# Patient Record
Sex: Female | Born: 1960
Health system: Southern US, Community
[De-identification: ages and names within clinical notes are randomized; demographics above are authoritative.]

## PROBLEM LIST (undated history)

## (undated) HISTORY — PX: OTHER SURGICAL HISTORY: SHX169

---

## 1997-11-26 ENCOUNTER — Other Ambulatory Visit: Admission: RE | Admit: 1997-11-26 | Discharge: 1997-11-26 | Payer: Self-pay | Admitting: Obstetrics and Gynecology

## 1999-06-25 ENCOUNTER — Other Ambulatory Visit
Admission: RE | Admit: 1999-06-25 | Discharge: 1999-06-25 | Payer: Self-pay | Admitting: Physical Medicine & Rehabilitation

## 1999-10-13 ENCOUNTER — Encounter (INDEPENDENT_AMBULATORY_CARE_PROVIDER_SITE_OTHER): Payer: Self-pay | Admitting: Specialist

## 1999-10-13 ENCOUNTER — Ambulatory Visit (HOSPITAL_COMMUNITY): Admission: RE | Admit: 1999-10-13 | Discharge: 1999-10-13 | Payer: Self-pay | Admitting: Obstetrics and Gynecology

## 2000-07-14 ENCOUNTER — Other Ambulatory Visit: Admission: RE | Admit: 2000-07-14 | Discharge: 2000-07-14 | Payer: Self-pay | Admitting: Obstetrics and Gynecology

## 2000-07-27 ENCOUNTER — Ambulatory Visit (HOSPITAL_COMMUNITY): Admission: RE | Admit: 2000-07-27 | Discharge: 2000-07-27 | Payer: Self-pay | Admitting: Obstetrics and Gynecology

## 2000-07-27 ENCOUNTER — Encounter: Payer: Self-pay | Admitting: Obstetrics and Gynecology

## 2001-08-01 ENCOUNTER — Encounter: Payer: Self-pay | Admitting: Obstetrics and Gynecology

## 2001-08-01 ENCOUNTER — Ambulatory Visit (HOSPITAL_COMMUNITY): Admission: RE | Admit: 2001-08-01 | Discharge: 2001-08-01 | Payer: Self-pay | Admitting: Obstetrics and Gynecology

## 2001-12-27 ENCOUNTER — Ambulatory Visit (HOSPITAL_COMMUNITY): Admission: RE | Admit: 2001-12-27 | Discharge: 2001-12-27 | Payer: Self-pay | Admitting: Gastroenterology

## 2002-08-03 ENCOUNTER — Encounter: Admission: RE | Admit: 2002-08-03 | Discharge: 2002-08-03 | Payer: Self-pay | Admitting: Obstetrics and Gynecology

## 2002-08-03 ENCOUNTER — Encounter: Payer: Self-pay | Admitting: Obstetrics and Gynecology

## 2003-08-07 ENCOUNTER — Other Ambulatory Visit
Admission: RE | Admit: 2003-08-07 | Discharge: 2003-08-07 | Payer: Self-pay | Admitting: Physical Medicine & Rehabilitation

## 2003-09-05 ENCOUNTER — Encounter: Admission: RE | Admit: 2003-09-05 | Discharge: 2003-09-05 | Payer: Self-pay | Admitting: Obstetrics and Gynecology

## 2004-10-14 ENCOUNTER — Other Ambulatory Visit: Admission: RE | Admit: 2004-10-14 | Discharge: 2004-10-14 | Payer: Self-pay | Admitting: Obstetrics and Gynecology

## 2004-11-04 ENCOUNTER — Encounter: Admission: RE | Admit: 2004-11-04 | Discharge: 2004-11-04 | Payer: Self-pay | Admitting: Obstetrics and Gynecology

## 2004-11-12 ENCOUNTER — Encounter: Admission: RE | Admit: 2004-11-12 | Discharge: 2004-11-12 | Payer: Self-pay | Admitting: Obstetrics and Gynecology

## 2005-11-24 ENCOUNTER — Encounter: Admission: RE | Admit: 2005-11-24 | Discharge: 2005-11-24 | Payer: Self-pay | Admitting: Obstetrics and Gynecology

## 2006-12-12 ENCOUNTER — Encounter: Admission: RE | Admit: 2006-12-12 | Discharge: 2006-12-12 | Payer: Self-pay | Admitting: Obstetrics and Gynecology

## 2007-12-21 ENCOUNTER — Encounter: Admission: RE | Admit: 2007-12-21 | Discharge: 2007-12-21 | Payer: Self-pay | Admitting: Obstetrics and Gynecology

## 2009-07-07 ENCOUNTER — Encounter: Admission: RE | Admit: 2009-07-07 | Discharge: 2009-07-07 | Payer: Self-pay | Admitting: Obstetrics and Gynecology

## 2010-06-28 ENCOUNTER — Encounter: Payer: Self-pay | Admitting: Obstetrics and Gynecology

## 2010-10-23 NOTE — Procedures (Signed)
[facility]  Patient:    [name], [name] Visit Number: [id number] MRN: [id number]          Service Type: END Location: ENDO Attending Physician:  Dennison Bulla Ii Dictated by:   Verlin Grills, M.D. Proc. Date: 12/27/01 Admit Date:  12/27/2001 Discharge Date: 12/27/2001   CC:         Lafonda Mosses B. Thomasena Edis, M.D.   Procedure Report  PROCEDURE:  Diagnostic colonoscopy.  REFERRING PHYSICIAN:  Lafonda Mosses B. Thomasena Edis, M.D.  PROCEDURE INDICATION:  [name] is a 50 year old female born Aug 06, 1960.  [name] underwent her health maintenance physical examination performed by Dr. Artist Pais.  She submitted stool Hemoccult cards for testing, and her Hemoccult cards were guaiac-positive. [name] tells me she did have symptomatic hemorrhoids prior to submitting her stool Hemoccult cards.  She denies a personal or family history of colon cancer.  MEDICATION ALLERGIES:  None.  CHRONIC MEDICATIONS:  Birth control pills.  PAST MEDICAL HISTORY:  None.  PAST SURGICAL HISTORY:  D&C to remove uterine polyp, May 2001.  HABITS:  [name] does not smoke cigarettes or consume alcohol.  SOCIAL HISTORY:  [name] 68 year old husband is in good health.  She has no children.  ENDOSCOPIST:  Verlin Grills, M.D.  PREMEDICATION:  Versed 7 mg, Demerol 500 mg.  ENDOSCOPE:  Olympus pediatric colonoscope.  DESCRIPTION OF PROCEDURE:  After obtaining informed consent, [name] was placed in the left lateral decubitus position.  I administered intravenous Versed and intravenous Demerol to achieve conscious sedation for the procedure.  The patients blood pressure, oxygen saturation, and cardiac rhythm were monitored throughout the procedure and documented in the medical record.  Anal inspection was normal.  Digital rectal examination revealed moderate-sized external hemorrhoids which did not feel thrombosed, and there was no bleeding.    The Olympus pediatric video colonoscope was introduced into the rectum and easily advanced to the cecum.  Colonic preparation for the exam today was excellent.  RECTUM:  Normal.  SIGMOID COLON AND DESCENDING COLON:  Normal.  SPLENIC FLEXURE:  Normal.  TRANSVERSE COLON:  Normal.  HEPATIC FLEXURE:  Normal.  ASCENDING COLON:  Normal.  CECUM AND ILEOCECAL VALVE:  Normal.  ASSESSMENT:  Normal proctocolonoscopy to the cecum.  No endoscopic evidence for the presence of colorectal neoplasia or inflammatory bowel disease. Dictated by:   Verlin Grills, M.D. Attending Physician:  Dennison Bulla Ii DD:  12/27/01 TD:  12/30/01 Job: [id number]

## 2010-10-23 NOTE — Op Note (Signed)
[facility]  Patient:    [name], [name]                        MRN: [id number] Proc. Date: 10/13/99 Adm. Date:  [id number] Attending:  Madelyn Flavors                           Operative Report  PREOPERATIVE DIAGNOSIS:       Endometrial polyps.  POSTOPERATIVE DIAGNOSIS:      Endometrial polyps.  OPERATION:                    Dilatation and curettage.  Hysteroscopy.  SURGEON:                      Beather Arbour. Thomasena Edis, M.D.  ASSISTANT:  ANESTHESIA:                   General by LMA.  ESTIMATED BLOOD LOSS:         Minimal.  COMPLICATIONS:                None.  DRAINS:                       None.  FLUIDS:                       Approximately 900 cc of Crystalloid.  DESCRIPTION OF PROCEDURE:     The patient was brought to the operating room and  identified on the operating table.  After induction of adequate general anesthesia, the patient was placed in the dorsal lithotomy position and prepped and draped n the usual sterile fashion.  The bladder was straight catheterized for approximately 100 cc of clear yellow urine.  Examination under anesthesia revealed the uterus to be six weeks, mobile, and retroverted.  The posterior lip of the cervix was grasped with a single tooth tenaculum and dilated up to a #25 Pratt dilator. Dilatation proceeded very carefully and smoothly.  This was done to decrease the risk of uterine perforation.  The hysteroscope was placed with ease and using Sorbitol s the distending medium, a very careful and thorough hysteroscopic examination was performed.  The polypoid tissue was visualized.  The hysteroscope was removed and the uterus was systematically curetted in a systematic clockwise fashion with copious tissue obtained.  The Randall stone forceps were placed and additional tissue was obtained.  After there was no further tissue obtained and a good cry was heard all around, the procedure was then terminated.   The tissue was sent to pathology for examination.  There was noted to be very minimal bleeding from the tenaculum site.  Approximately 10 cc of lidocaine were placed for a paracervical block for postoperative analgesia.  The patient did receive Toradol 30 mg IV just prior to the end of the case for analgesia as well.  The patient tolerated the procedure well without apparent complications and was transferred to the recovery room in stable condition after all sponge, needle, and instrument counts were correct.  She was given the D&C postoperative instruction sheet, urged to return to the office in two to three weeks for a postoperative appointment and to use ibuprofen two to three pills that is 400 to 600 mg as needed for pain every six  hours. DD:  10/13/99 TD:  10/13/99 Job: [id number]  [id number]

## 2010-12-25 ENCOUNTER — Other Ambulatory Visit: Payer: Self-pay | Admitting: Otolaryngology

## 2010-12-25 DIAGNOSIS — R221 Localized swelling, mass and lump, neck: Secondary | ICD-10-CM

## 2010-12-28 ENCOUNTER — Ambulatory Visit
Admission: RE | Admit: 2010-12-28 | Discharge: 2010-12-28 | Disposition: A | Discharge status: Home / Self Care | Payer: 59 | Source: Ambulatory Visit | Attending: Otolaryngology | Admitting: Otolaryngology

## 2010-12-28 DIAGNOSIS — R221 Localized swelling, mass and lump, neck: Secondary | ICD-10-CM

## 2010-12-28 MED ORDER — IOHEXOL 300 MG/ML  SOLN
75.0000 mL | Freq: Once | INTRAMUSCULAR | Status: AC | PRN
  Administered 2010-12-28: 75 mL via INTRAVENOUS

## 2010-12-29 ENCOUNTER — Other Ambulatory Visit: Payer: Self-pay | Admitting: Otolaryngology

## 2010-12-29 ENCOUNTER — Other Ambulatory Visit (HOSPITAL_COMMUNITY)
Admission: RE | Admit: 2010-12-29 | Discharge: 2010-12-29 | Disposition: A | Discharge status: Home / Self Care | Payer: 59 | Source: Ambulatory Visit | Attending: Otolaryngology | Admitting: Otolaryngology

## 2010-12-29 DIAGNOSIS — R220 Localized swelling, mass and lump, head: Secondary | ICD-10-CM | POA: Insufficient documentation

## 2010-12-29 DIAGNOSIS — R221 Localized swelling, mass and lump, neck: Secondary | ICD-10-CM | POA: Insufficient documentation

## 2011-01-07 ENCOUNTER — Encounter (HOSPITAL_COMMUNITY)
Admission: RE | Admit: 2011-01-07 | Discharge: 2011-01-07 | Disposition: A | Discharge status: Home / Self Care | Payer: 59 | Source: Ambulatory Visit | Attending: Otolaryngology | Admitting: Otolaryngology

## 2011-01-07 LAB — CBC
HCT: 32.2 % — ABNORMAL LOW (ref 36.0–46.0)
MCH: 31.2 pg (ref 26.0–34.0)
MCHC: 34.8 g/dL (ref 30.0–36.0)
MCV: 89.7 fL (ref 78.0–100.0)
Platelets: 228 10*3/uL (ref 150–400)

## 2011-01-07 LAB — ELECTROLYTE PANEL: Chloride: 104 mEq/L (ref 96–112)

## 2011-01-07 LAB — DIFFERENTIAL
Basophils Absolute: 0 K/uL (ref 0.0–0.1)
Basophils Relative: 0 % (ref 0–1)
Eosinophils Absolute: 0.2 K/uL (ref 0.0–0.7)
Eosinophils Relative: 4 % (ref 0–5)
Lymphocytes Relative: 55 % — ABNORMAL HIGH (ref 12–46)
Lymphs Abs: 2.8 K/uL (ref 0.7–4.0)
Monocytes Absolute: 0.3 K/uL (ref 0.1–1.0)
Monocytes Relative: 6 % (ref 3–12)
Neutro Abs: 1.7 K/uL (ref 1.7–7.7)
Neutrophils Relative %: 34 % — ABNORMAL LOW (ref 43–77)

## 2011-01-07 LAB — TYPE AND SCREEN
ABO/RH(D): A POS
Antibody Screen: NEGATIVE

## 2011-01-07 LAB — PROTIME-INR
INR: 1.03 (ref 0.00–1.49)
Prothrombin Time: 13.7 s (ref 11.6–15.2)

## 2011-01-07 LAB — SURGICAL PCR SCREEN
MRSA, PCR: NEGATIVE
Staphylococcus aureus: NEGATIVE

## 2011-01-08 ENCOUNTER — Inpatient Hospital Stay (HOSPITAL_COMMUNITY)
Admission: RE | Admit: 2011-01-08 | Discharge: 2011-01-09 | DRG: 139 | Disposition: A | Discharge status: Home / Self Care | Payer: 59 | Source: Ambulatory Visit | Attending: Otolaryngology | Admitting: Otolaryngology

## 2011-01-08 ENCOUNTER — Other Ambulatory Visit: Payer: Self-pay | Admitting: Otolaryngology

## 2011-01-08 DIAGNOSIS — R51 Headache: Secondary | ICD-10-CM | POA: Diagnosis present

## 2011-01-08 DIAGNOSIS — K1120 Sialoadenitis, unspecified: Principal | ICD-10-CM | POA: Diagnosis present

## 2011-01-10 NOTE — Op Note (Signed)
[name], [name] NO.:  [id number]  MEDICAL RECORD NO.:  [id number]  LOCATION:  5124                         FACILITY:  [facility]  PHYSICIAN:  Melvenia Beam, MD      DATE OF BIRTH:  Feb 10, 1961  DATE OF PROCEDURE:  01/08/2011 DATE OF DISCHARGE:                              OPERATIVE REPORT   PREOPERATIVE DIAGNOSES:  Level IIA/tail of parotid neck mass.  POSTOPERATIVE DIAGNOSES:  Level IIA/tail of parotid neck mass/right post-inflammatory tail of parotid mass.  PROCEDURES PERFORMED: 1. CPT code 38510-R right open biopsy of deep neck mass. 2. 95920 facial nerve monitoring x2 hours.  SURGEON:  Melvenia Beam, MD  ASSISTANT:  Aquilla Hacker, PA-C  OPERATIVE FINDINGS:  Intact right facial nerve in all branches, intact right great auricular nerve in all branches, right level IIA/tail of parotid neck mass, negative on frozen section for malignancy and showing chronic inflammatory reaction, fibrosis and salivary gland tissue.  SPECIMENS:  Right parotid mass #1 and right parotid mass #2.  COMPLICATIONS:  None.  ANESTHESIA:  General endotracheal.  DRAINS:  None.  DRESSINGS:  Bacitracin ointment.  OPERATIVE DETAILS:  The patient was correctly identified in the preoperative holding area, brought back to operating room by Anesthesia, placed on the operating table, intubated without incident.  The table was turned 90 degrees counterclockwise by Anesthesia.  The right level II neck mass was palpated and marked out with an X.  The inferior portion of a standard modified Blair incision was then drawn out taking care to include this in a natural crease in the neck.  The area was injected 1% lidocaine plain, and then the entire right face and neck were prepped and draped with Betadine and sterile towels in the standard fashion.  Next, the incision was made with the 15 blade, carried down through the platysma layer.  The platysma was divided and then posteriorly,  directly over the mass, the skin flap was elevated in a supraplatysmal plane anteriorly and in the anterior and lower neck, the flap was elevated in a subplatysmal plane posteriorly and inferiorly, the flap was elevated in a subplatysmal plane.  The skin flaps were then carefully retracted with 2-0 silk and snaps.  Next, the Bovie electrocautery and the mosquito forceps were used to carefully identify the right great auricular nerve.  This was carefully traced superiorly along the sternocleidomastoid muscle all the way superiorly near the mastoid tip. The right tail of parotid mass was palpated and was noted that the great auricular nerve coursed just posterior to this.  The great auricular nerve was then carefully traced out in all of its branches preserving all three of these branches superiorly, and the right tail of parotid mass was carefully dissected off of the great auricular nerve with the mosquito and bipolar.  The right tail of parotid mass was then carefully dissected off of the mastoid tip and sternocleidomastoid muscle and removed in two pieces as right tail of parotid mass #1 and right tail of parotid mass #2.  The nerve monitor was used continuously to ensure that the right marginal mandibular nerve was not injured or divided, and the right facial nerve  stimulated normally throughout  the case in its upper and lower divisions.  Once the mass was carefully removed, it was sent off for frozen section pathology.  This came back as showing background inflammation, chronic fibrosis, and salivary glad tissue with no evidence of any neoplasia and no evidence of any malignancy. We ensured meticulous hemostasis using the bipolar electrocautery and then the wound was closed in two layers closing the platysmal and subcutaneous layers, and closing this with 3-0 Vicryl interrupted sutures, which were buried and then closing the skin with interrupted simple 5-0 Prolene sutures.  The  patient's face was cleaned of the  Betadine prep.  She was returned back to Anesthesia, awaked from anesthesia, extubated without incident, and taken to the postop recovery room in good condition.  The patient tolerated the procedure well without any complications.    Dr. Melvenia Beam was present and performed the entire procedure.  The assistance of Aquilla Hacker was necessary as no Sales promotion account executive was available.          ______________________________ Melvenia Beam, MD     MG/MEDQ  D:  01/08/2011  T:  01/09/2011  Job:  [id number]  Electronically Signed by Melvenia Beam MD on 01/10/2011 03:59:51 PM

## 2011-01-10 NOTE — H&P (Signed)
  [name], Andrea NO.:  [id number]  MEDICAL RECORD NO.:  [id number]  LOCATION:  SDS                          FACILITY:  [facility]  PHYSICIAN:  Andrea Beam, MD      DATE OF BIRTH:  1960/12/08  DATE OF ADMISSION:  01/08/2011 DATE OF DISCHARGE:  01/09/2011                             HISTORY & PHYSICAL   REASON FOR ADMISSION:  Status post excision of a right tail of parotid/level II benign neck mass and the patient is admitted for postoperative monitoring overnight.  HISTORY OF PRESENT ILLNESS:  This is a very pleasant 50 year old Caucasian female who has had a 3-4 week history of a right tail of parotid mass which was initially quite inflamed and tender to palpate. This was treated empirically as parotitis with 2 rounds of doxycycline and the mass significantly improved in its appearance, but CT scan obtained after the doxycycline course demonstrated a persistent 2.5 cm right tail of parotid/level IIA neck mass with surrounding associated lymphadenopathy with findings worrisome for possible malignancy.  Given the findings, an FNA was performed which came back as necrosis, salivary gland tissue and chronic inflammation.  The mass continued to persist although somewhat smaller after an additional round of doxycycline. Given this, after extensive discussion with the patient regarding the risks, benefits and alternatives of surgery versus observation.  The patient elected to undergo excisional biopsy of this, which she underwent on January 08, 2011.  PAST MEDICAL AND SURGICAL HISTORY:  Significant for gingivoplasty, otherwise negative.  She is a nonsmoker.  PHYSICAL EXAMINATION:  NECK:  Reveals a right modified Blair-type incision, status post excision of the right level II A neck mass.  This is clean, dry, and intact with Prolene sutures with no evidence of any hematoma and the neck is soft and flat.  Trachea is midline. NEURO:  Cranial nerves II-XII grossly  intact and symmetric bilaterally. GENERAL:  The patient is awake and alert, in no acute distress. HEENT:  Oral cavity and oropharynx symmetric and well hydrated.  Moist mucous membranes except the oral cavity. No masses or lesions. Extraocular movements intact.  Pupils equal, round, reactive to light and accommodation.  The patient is normocephalic and atraumatic.  IMPRESSION AND PLAN:  Status post excision of right tail of parotid benign postinflammatory mass.  We will admit the patient overnight for observation with standard postop antibiotics, postoperative antinausea medicines and pain medications.  We will anticipate discharge in the morning.          ______________________________ Andrea Beam, MD    MG/MEDQ  D:  01/08/2011  T:  01/09/2011  Job:  [id number]  Electronically Signed by Andrea Beam MD on 01/10/2011 03:54:12 PM

## 2011-03-03 ENCOUNTER — Other Ambulatory Visit: Payer: Self-pay | Admitting: Otolaryngology

## 2011-03-03 ENCOUNTER — Ambulatory Visit
Admission: RE | Admit: 2011-03-03 | Discharge: 2011-03-03 | Disposition: A | Discharge status: Home / Self Care | Payer: 59 | Source: Ambulatory Visit | Attending: Otolaryngology | Admitting: Otolaryngology

## 2011-03-03 DIAGNOSIS — R220 Localized swelling, mass and lump, head: Secondary | ICD-10-CM

## 2011-03-03 DIAGNOSIS — R221 Localized swelling, mass and lump, neck: Secondary | ICD-10-CM

## 2013-07-10 IMAGING — CT CT NECK W/ CM
4 of 5 series · 15 of 33 positions shown, 18 images · IV contrast (75CC OMNI 300)
Comparison: None

CLINICAL DATA: Mass at the angle of the mandible on the right.
Pain.

CT NECK WITH CONTRAST
TECHNIQUE: Multidetector CT imaging of the neck was performed with
intravenous contrast.
Contrast: 75 ml Bmnipaque-APP

[Series 2: axial neck · axial · 0.35mm/px · z∈[-192,-145]mm · 2 of 97 slices shown]
[im 20/97  bone]
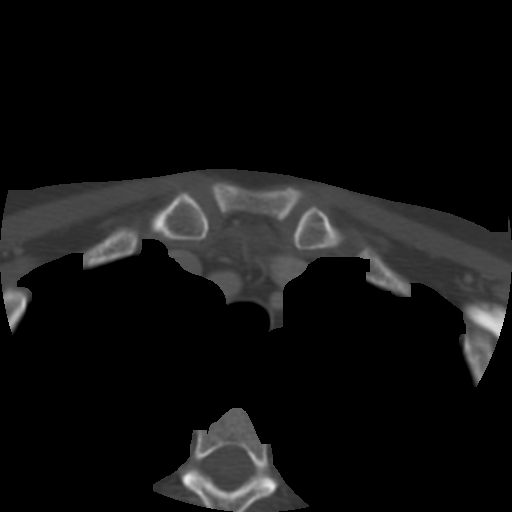
[im 39/97  bone]
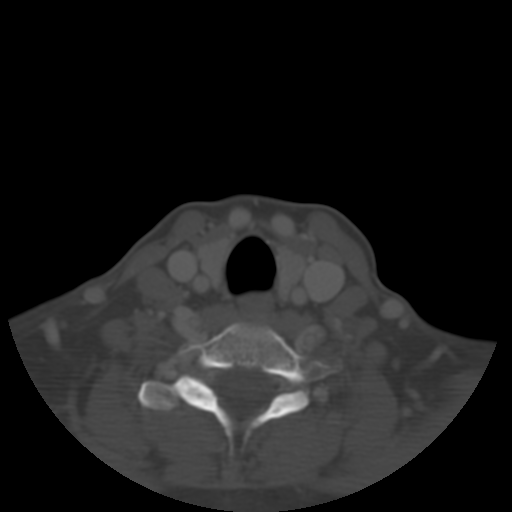

[Series 400: cor · coronal · 0.48mm/px · 3 of 86 slices shown]
[im 22/86  bone]
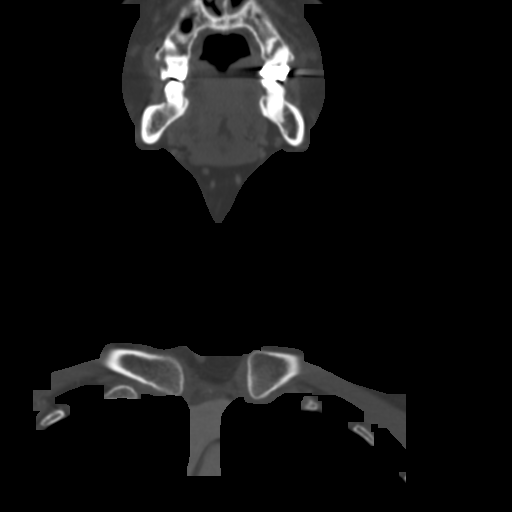
[im 36/86  bone]
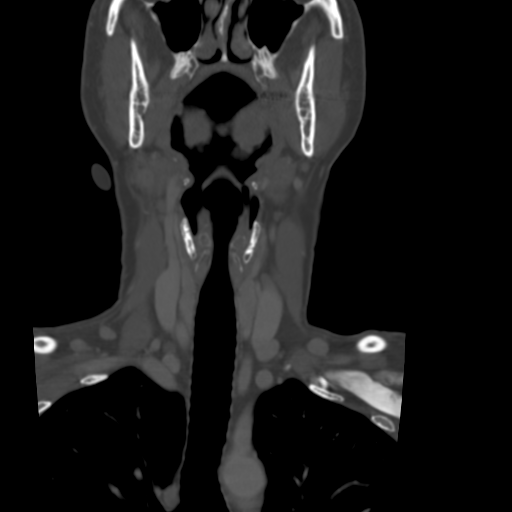
[im 50/86  bone]
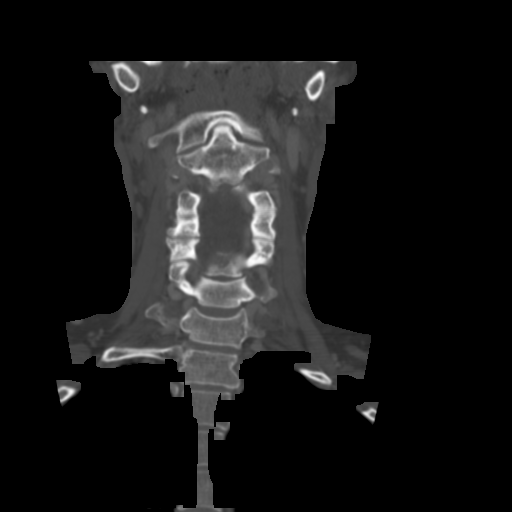

[Series 401: sag · sagittal · 0.48mm/px · 5 of 86 slices shown, 6 images]
[im 29/86  bone]
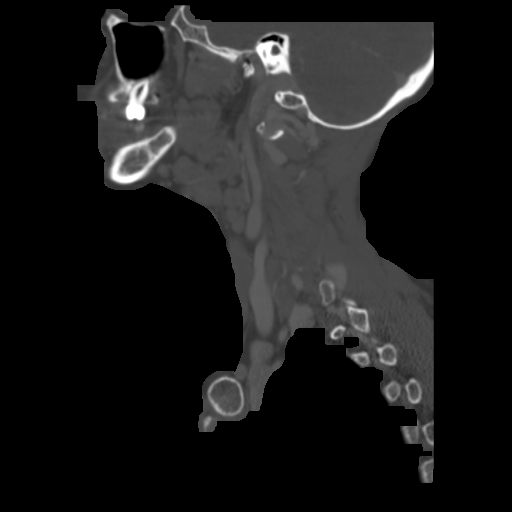
[im 36/86  bone]
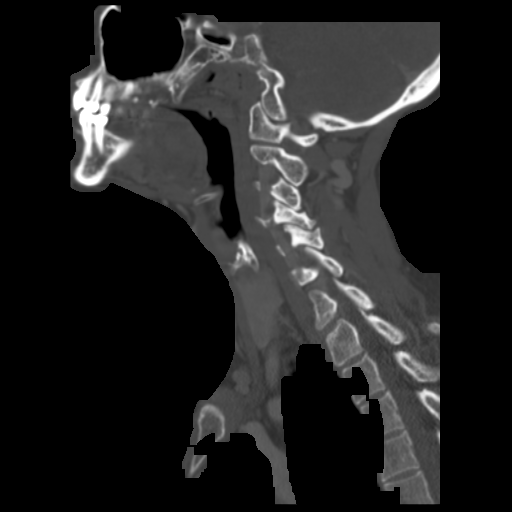
[im 43/86  soft-tissue]
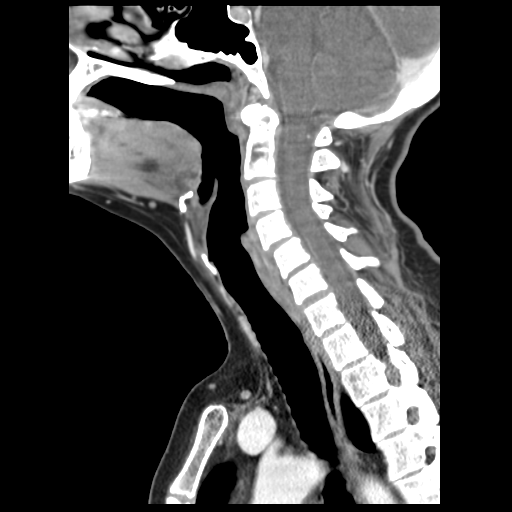
[im 43/86  bone]
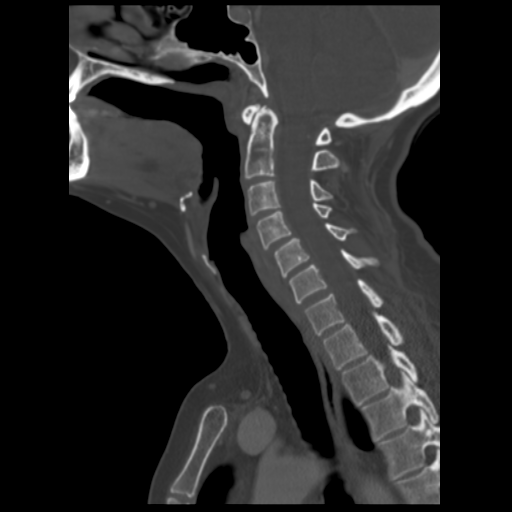
[im 50/86  bone]
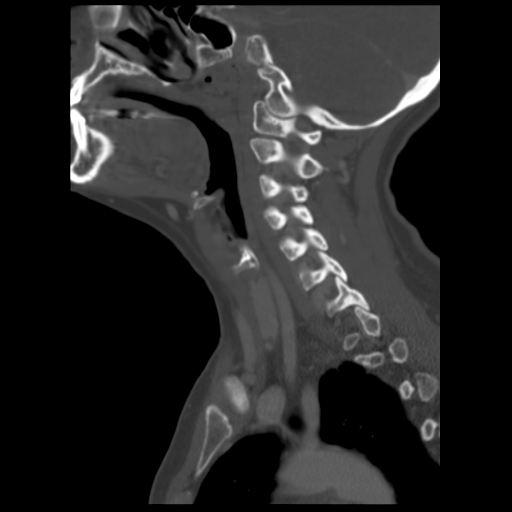
[im 57/86  bone]
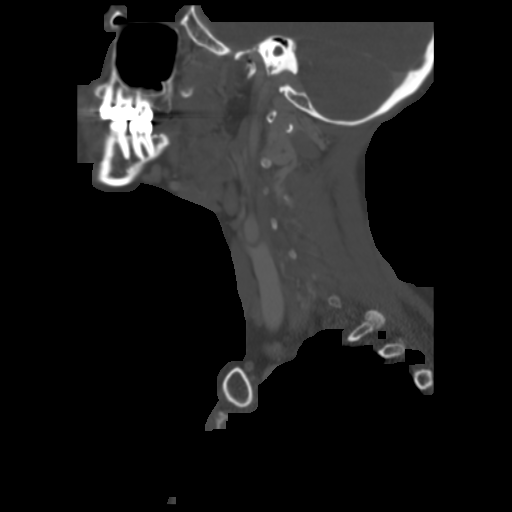

[Series 402: axial · axial · 0.35mm/px · z∈[-224,-73]mm · 5 of 119 slices shown, 7 images]
[im 20/119  soft-tissue]
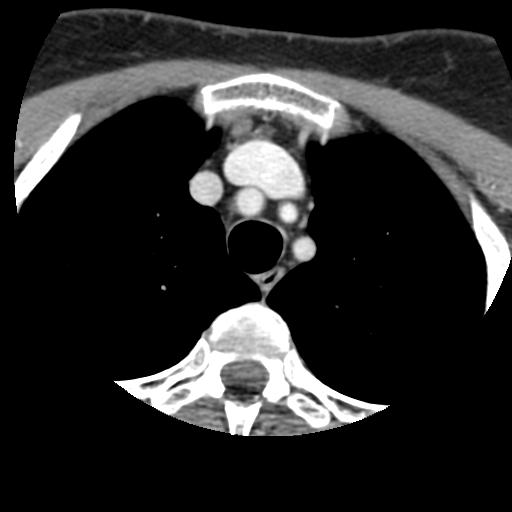
[im 20/119  bone]
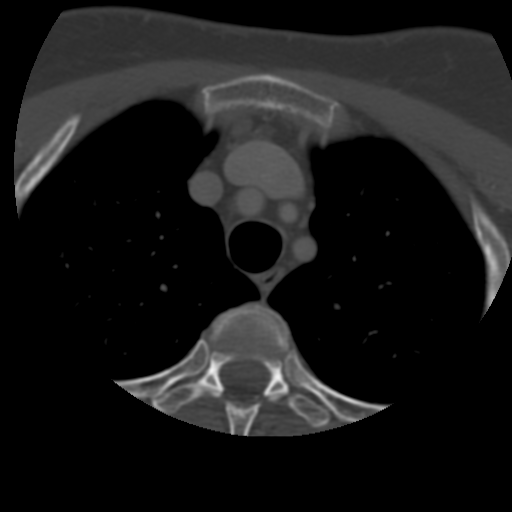
[im 40/119  bone]
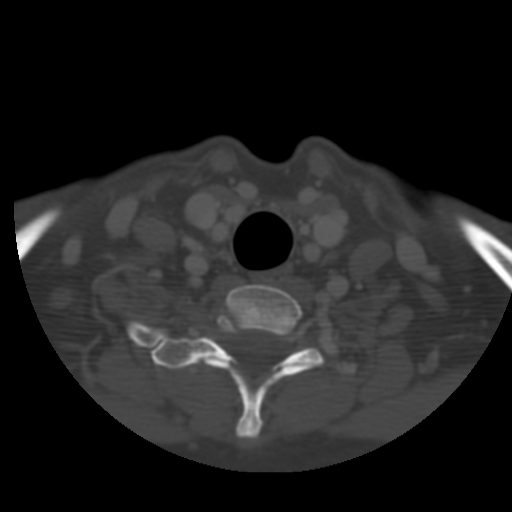
[im 60/119  bone]
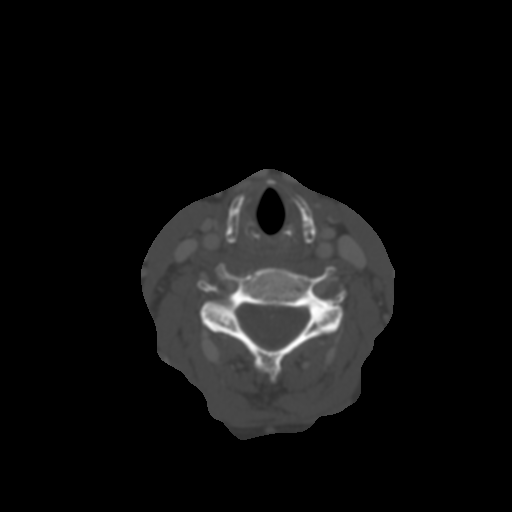
[im 79/119  bone]
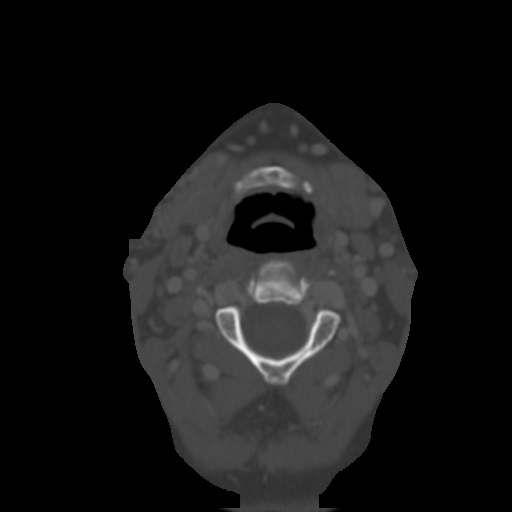
[im 99/119  soft-tissue]
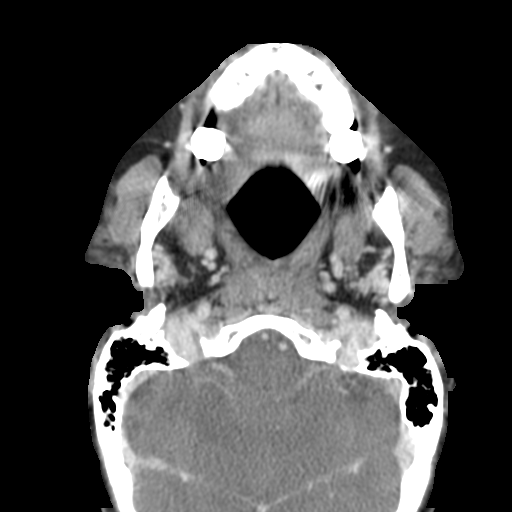
[im 99/119  bone]
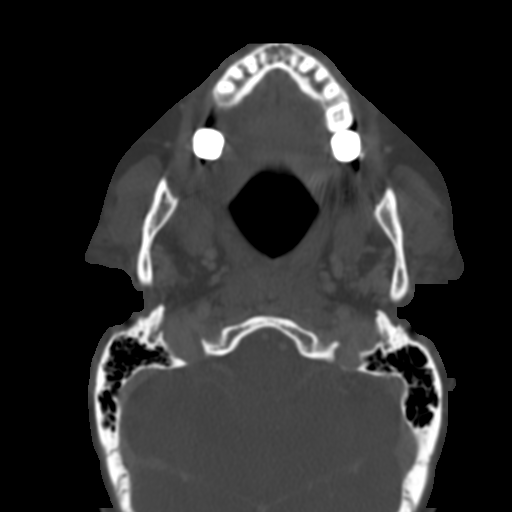

[15 of 33 positions shown; findings below may reference images not displayed]

FINDINGS: Lung apices show mild scarring.  No superior mediastinal
lesion.  Limited visualization of the intracranial contents does
not show any abnormality.  Visualized sinuses, middle ears and
mastoids are clear.

There is no pathologic finding on the left side of the neck.  The
parotid and submandibular glands appear normal.  No worrisome lymph
nodes are present on the left.  In the lower neck, the thyroid
gland appears normal.  Arterial and venous structures all appear
normal.

There is an abnormality on the right at the inferior aspect of the
deep lobe of the parotid gland, and at the inferior aspect of the
superficial lobe of the parotid gland.  In both of those areas,
there are indistinct 10 mm abnormalities.  Inferior to that, there
are three somewhat worrisome level III lymph nodes, on the order of
6-9 mm in diameter, but questionably slightly dense and asymmetric
to the other side.

This appearance is indeterminate and could relate to inflammation
of the parotid gland with regional adenopathy.  However, the
possibility of a parotid neoplasm with regional metastatic lymph
nodes does exist, particularly given the indistinct nature, raising
the possibility of parotid carcinoma.

Depending on how she has responded to therapy, one could continue
to observe this over the short-term.  If response is not
convincing, I think this raises the level of concern regarding the
possibility of neoplastic disease.  At that point, MRI with and
without contrast would be suggested to help sort out what may be a
primary lesion versus adenopathy.
IMPRESSION: Abnormal appearance in the deep lobe of the parotid gland on the
right and at the inferior aspect of the superficial lobe of the
parotid gland on the right. 10 mm areas of indistinct abnormal
density in both of those locations.  Three level III lymph nodes
inferior to that of some concern because of density and asymmetry,
none larger than 9 mm in diameter.  See above discussion regarding
the distinction between inflammatory disease and neoplastic
disease.  Certainly, at this point, parotid malignancy (carcinoma)
does remain possible.

## 2013-09-13 IMAGING — US US SOFT TISSUE HEAD/NECK
1 series · 14 of 18 positions shown · non-contrast
Comparison: CT neck 12/28/2010.

CLINICAL DATA: Right neck swelling after surgery.

THYROID ULTRASOUND
TECHNIQUE: Focused sonography was performed in the right neck, at
the site of palpable abnormality.

[Series 1: us soft tissue head/neck · 0.08mm/px · 14 of 18 slices shown]
[im 1/18]
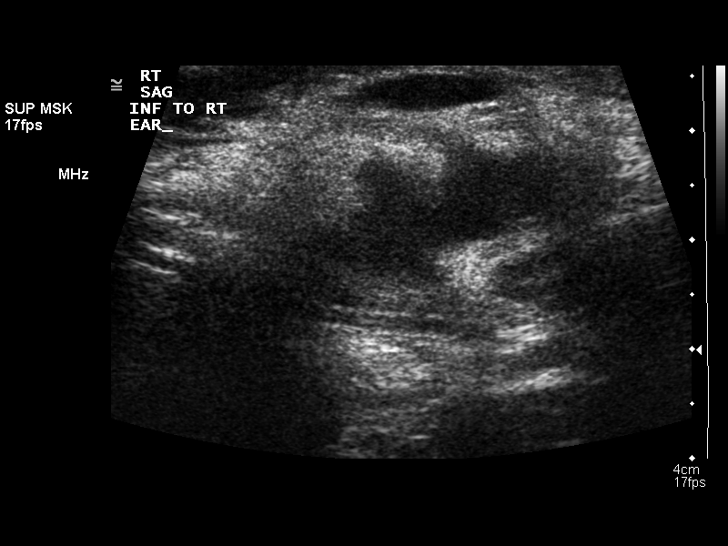
[im 2/18]
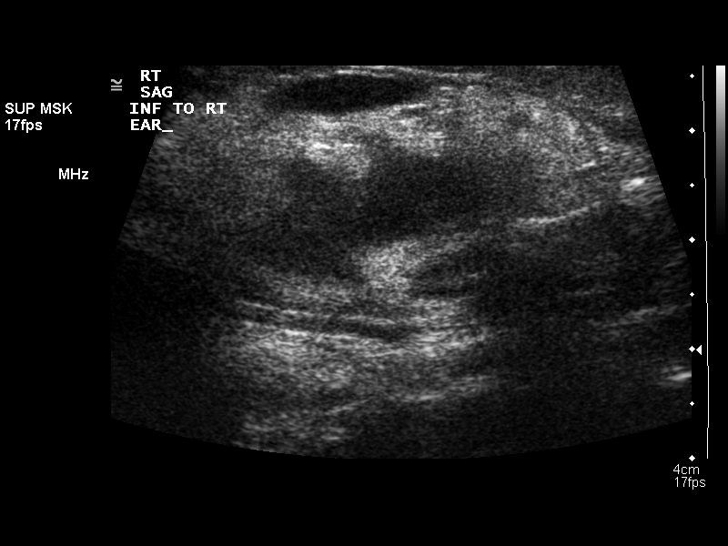
[im 4/18]
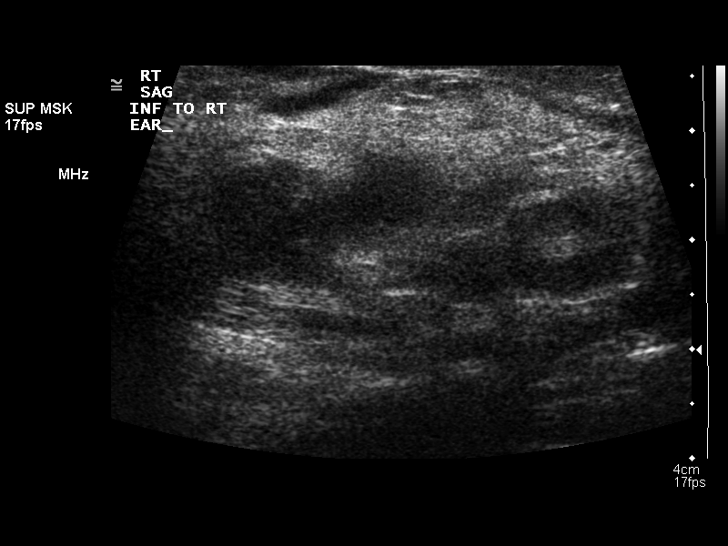
[im 5/18]
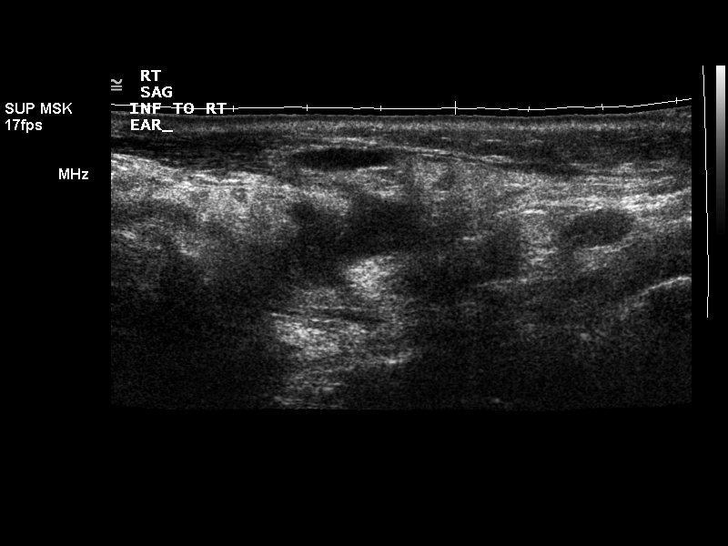
[im 6/18]
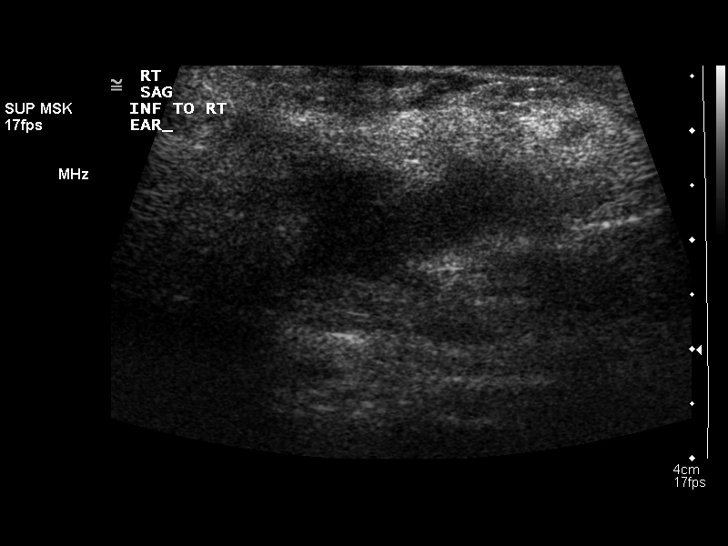
[im 8/18]
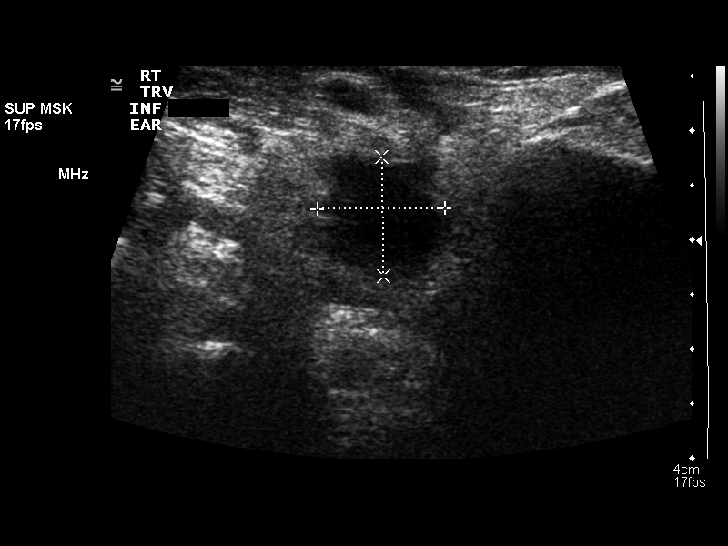
[im 9/18]
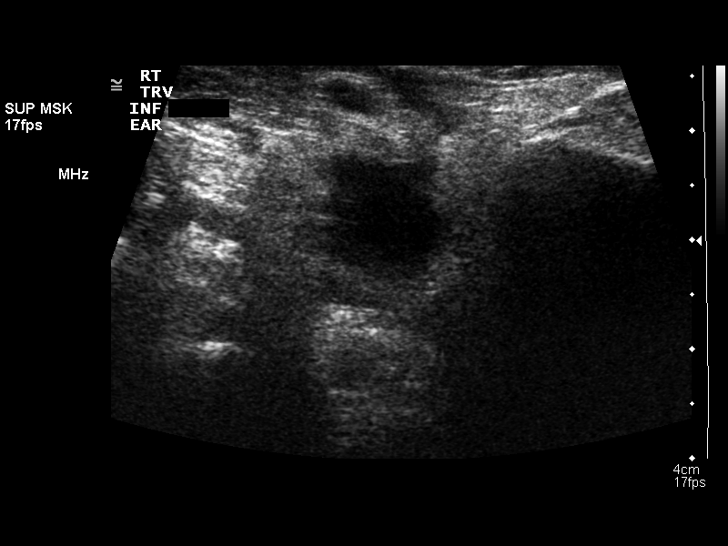
[im 10/18]
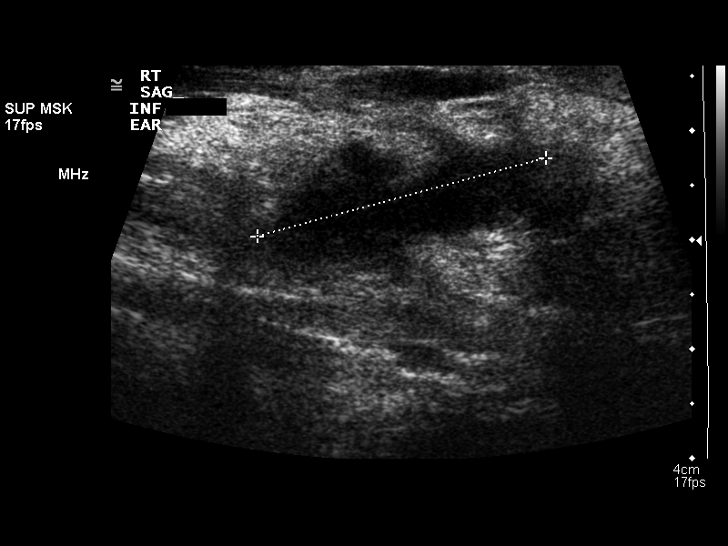
[im 11/18]
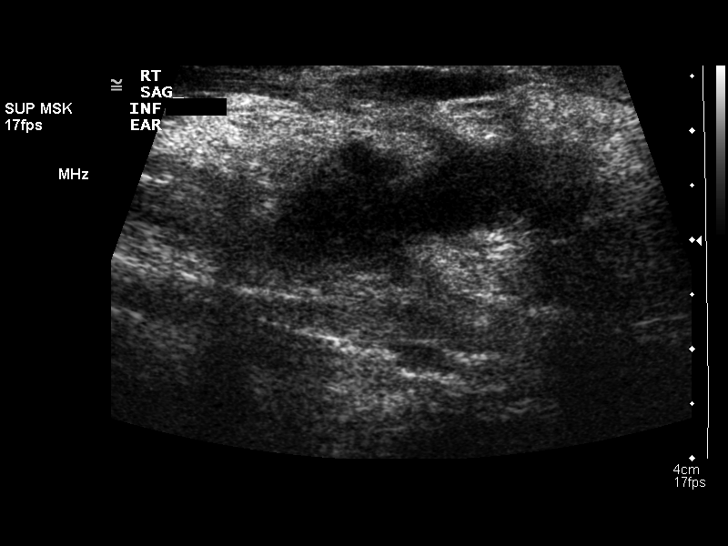
[im 13/18]
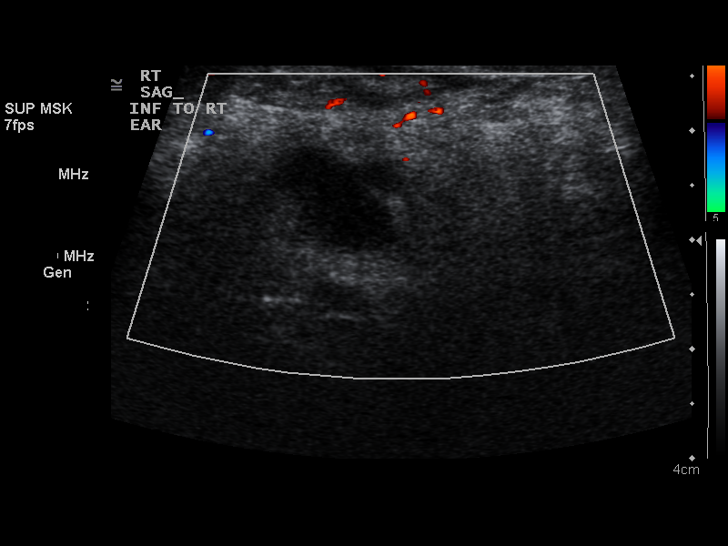
[im 14/18]
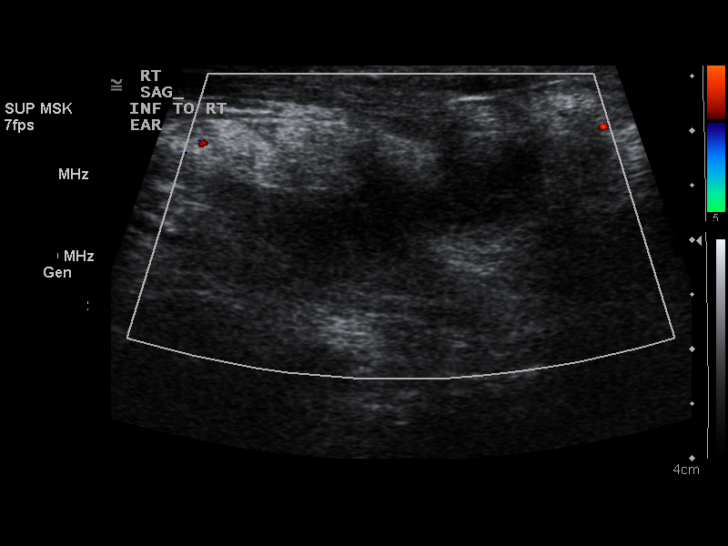
[im 15/18]
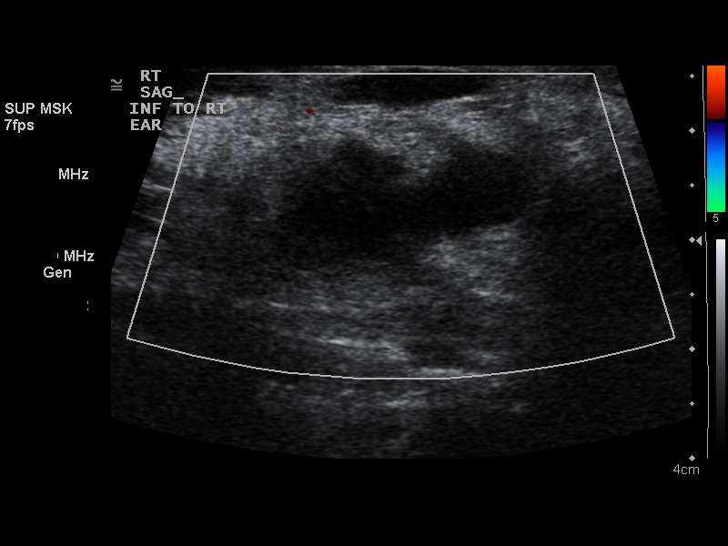
[im 17/18]
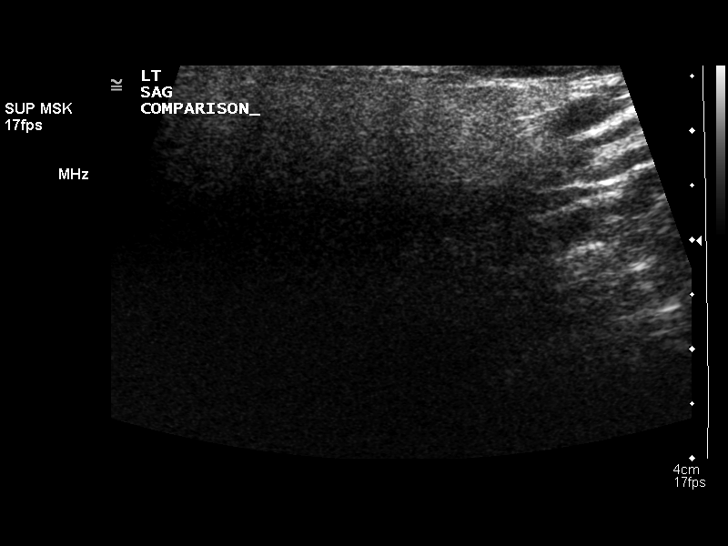
[im 18/18]
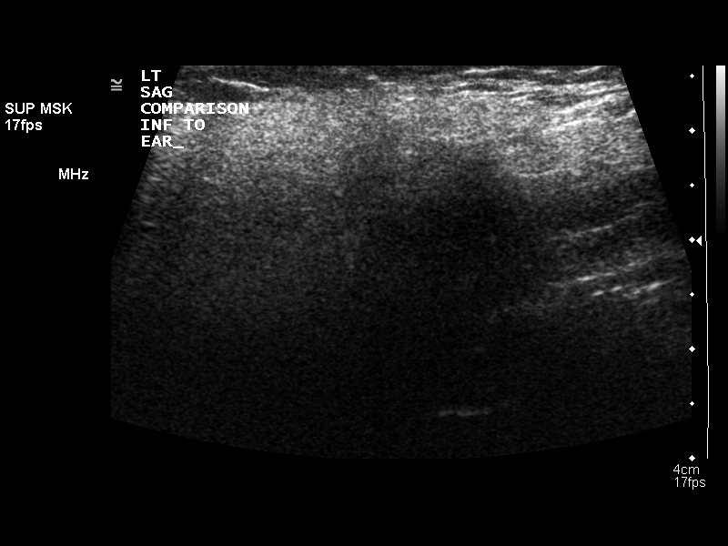

[14 of 18 positions shown; findings below may reference images not displayed]

FINDINGS: There is an irregular fluid collection in the right neck,
inferior to the right ear, measuring 1.2 x 1.1 x 2.7 cm.  There may
be echogenic debris within.
IMPRESSION: Complex fluid collection in the right neck, at the site of the
palpable abnormality. Postoperative seroma or abscess could have
this appearance.

## 2013-09-24 ENCOUNTER — Ambulatory Visit: Payer: 59

## 2013-09-24 ENCOUNTER — Ambulatory Visit (INDEPENDENT_AMBULATORY_CARE_PROVIDER_SITE_OTHER): Payer: 59 | Admitting: Family Medicine

## 2013-09-24 VITALS — BP 114/74 | HR 79 | Temp 98.2°F | Resp 16 | Ht 63.75 in | Wt 119.2 lb

## 2013-09-24 DIAGNOSIS — S6990XA Unspecified injury of unspecified wrist, hand and finger(s), initial encounter: Secondary | ICD-10-CM

## 2013-09-24 DIAGNOSIS — S6992XA Unspecified injury of left wrist, hand and finger(s), initial encounter: Secondary | ICD-10-CM

## 2013-09-24 DIAGNOSIS — S59919A Unspecified injury of unspecified forearm, initial encounter: Secondary | ICD-10-CM

## 2013-09-24 DIAGNOSIS — M25532 Pain in left wrist: Secondary | ICD-10-CM

## 2013-09-24 DIAGNOSIS — M25539 Pain in unspecified wrist: Secondary | ICD-10-CM

## 2013-09-24 DIAGNOSIS — S59909A Unspecified injury of unspecified elbow, initial encounter: Secondary | ICD-10-CM

## 2013-09-24 NOTE — Patient Instructions (Signed)
Wrist Pain Wrist injuries are frequent in adults and children. A sprain is an injury to the ligaments that hold your bones together. A strain is an injury to muscle or muscle cord-like structures (tendons) from stretching or pulling. Generally, when wrists are moderately tender to touch following a fall or injury, a break in the bone (fracture) may be present. Most wrist sprains or strains are better in 3 to 5 days, but complete healing may take several weeks. HOME CARE INSTRUCTIONS   Put ice on the injured area.  Put ice in a plastic bag.  Place a towel between your skin and the bag.  Leave the ice on for 15-20 minutes, 03-04 times a day, for the first 2 days.  Keep your arm raised above the level of your heart whenever possible to reduce swelling and pain.  Rest the injured area for at least 48 hours or as directed by your caregiver.  If a splint or elastic bandage has been applied, use it for as long as directed by your caregiver or until seen by a caregiver for a follow-up exam.  Only take over-the-counter or prescription medicines for pain, discomfort, or fever as directed by your caregiver.  Keep all follow-up appointments. You may need to follow up with a specialist or have follow-up X-rays. Improvement in pain level is not a guarantee that you did not fracture a bone in your wrist. The only way to determine whether or not you have a broken bone is by X-ray. SEEK IMMEDIATE MEDICAL CARE IF:   Your fingers are swollen, very red, white, or cold and blue.  Your fingers are numb or tingling.  You have increasing pain.  You have difficulty moving your fingers. MAKE SURE YOU:   Understand these instructions.  Will watch your condition.  Will get help right away if you are not doing well or get worse. Document Released: 03/03/2005 Document Revised: 08/16/2011 Document Reviewed: 07/15/2010 [employer] Patient Information 2014 [employer].  

## 2013-09-24 NOTE — Progress Notes (Signed)
° °  Subjective:    Patient ID: [name], female    DOB: 03-11-1961, 53 y.o.   MRN: [id number]  HPI This chart was scribed for Elvina SidleKurt Lauenstein, MD, MD by Charline BillsEssence Howell, ED Scribe. The patient was seen in room 1. Patient's care was started at 11:02 AM.  HPI Comments: [name] is a 53 y.o. female who presents to the Emergency Department complaining of L wrist injury. She reports falling while mopping the bathroom and landing on her wrist. She reports associated pain that is gradually improving, swelling and a tingling sensation due to the fall. She has tried a wrist brace for support with mild relief.   Pt works for [employer].   History reviewed. No pertinent past medical history. Past Surgical History  Procedure Laterality Date   Corotid abscess Right     Review of Systems  Musculoskeletal: Positive for joint swelling and myalgias.  Neurological: Positive for numbness.       Objective:   Physical Exam Left wrist reveals moderate swelling in the dorsal radial side of the wrist as well as index finger. She has good range of motion of the wrist. She is tender over the distal radius.  [facility] reading (PRIMARY) by  Dr. Milus GlazierLauenstein: Negative left wrist.         Assessment & Plan:   1. Wrist pain, left   2. Left wrist injury    I personally performed the services described in this documentation, which was scribed in my presence. The recorded information has been reviewed and is accurate.  Ibuprofen, thumb spica 5-7 days, ice  Elvina SidleKurt Lauenstein, MD

## 2015-08-15 ENCOUNTER — Other Ambulatory Visit: Payer: Self-pay | Admitting: Gastroenterology

## 2016-04-06 IMAGING — CR DG WRIST COMPLETE 3+V*L*
2 series · 2 of 2 positions shown · non-contrast
Comparison: None.

CLINICAL DATA: Wrist pain.  Fall 3 days ago.

EXAM:
LEFT WRIST - COMPLETE 3+ VIEW

[PA]
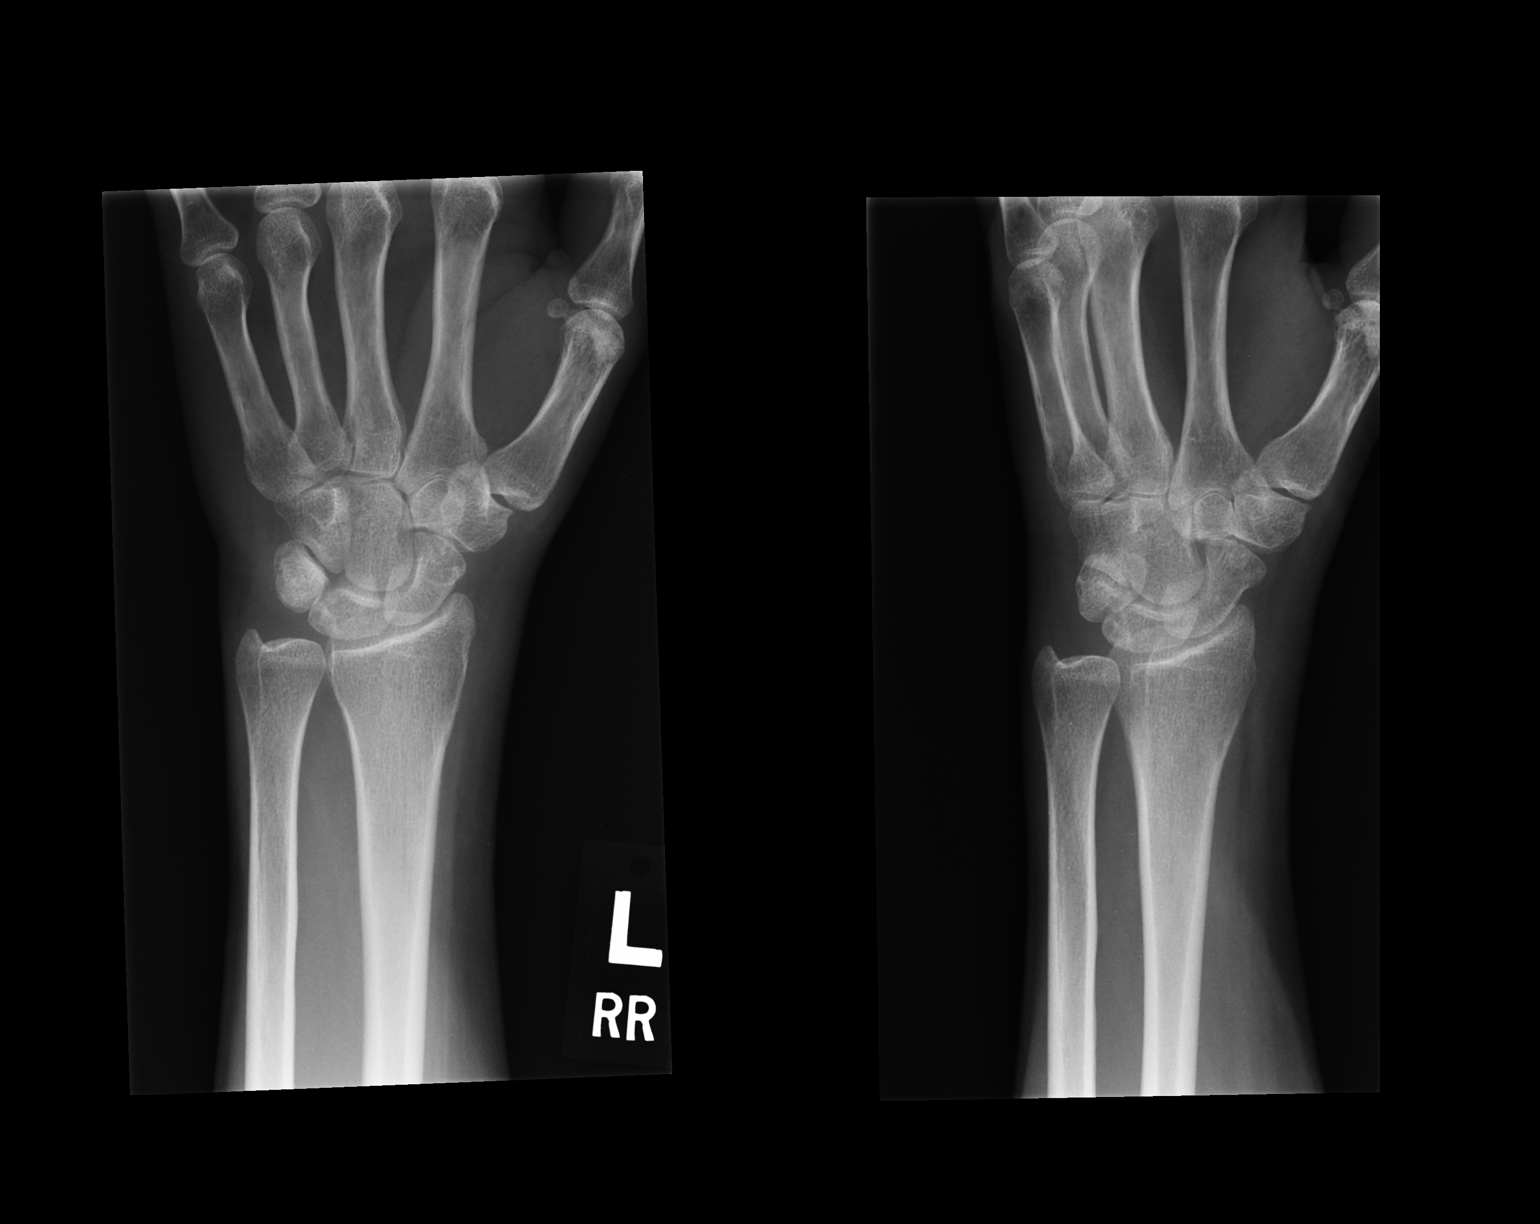

[lateral]
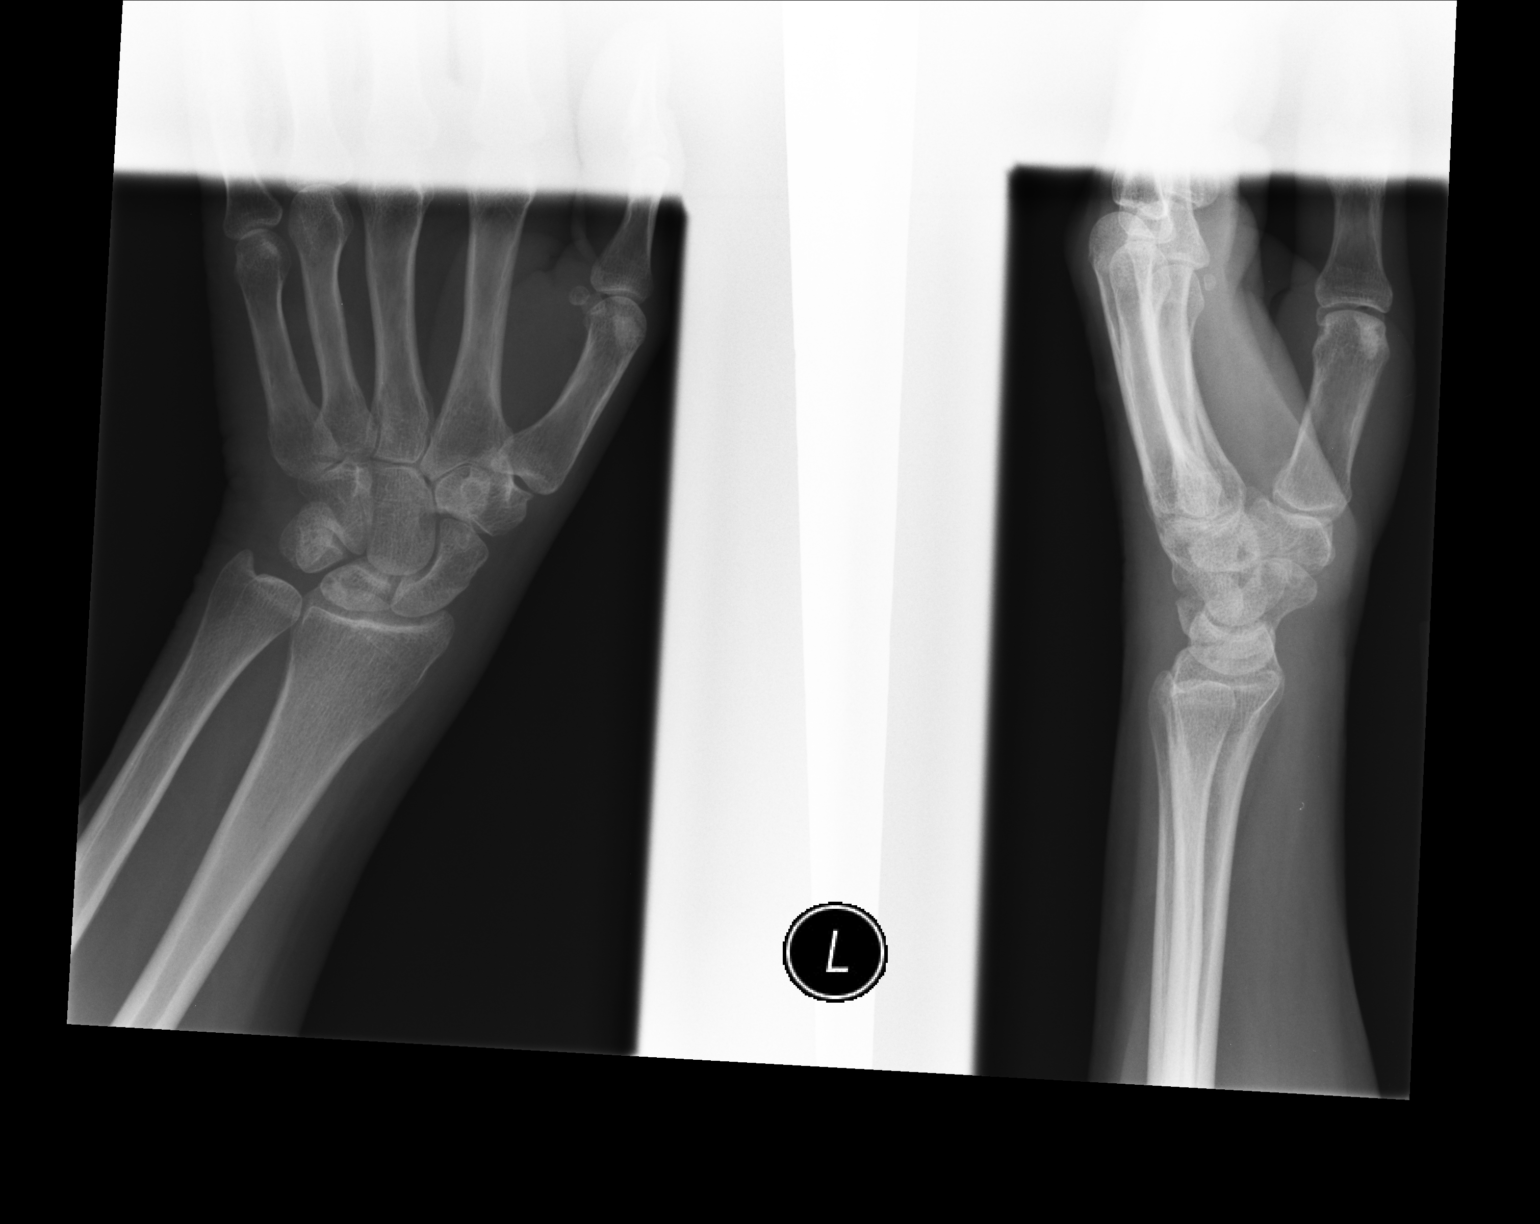

[2 of 2 positions shown; findings below may reference images not displayed]

FINDINGS: There is no evidence of fracture or dislocation. There is no
evidence of arthropathy or other focal bone abnormality. Soft
tissues are unremarkable.
IMPRESSION: Negative.

## 2016-04-14 DIAGNOSIS — R229 Localized swelling, mass and lump, unspecified: Secondary | ICD-10-CM | POA: Diagnosis not present

## 2016-06-10 DIAGNOSIS — R87610 Atypical squamous cells of undetermined significance on cytologic smear of cervix (ASC-US): Secondary | ICD-10-CM | POA: Diagnosis not present

## 2016-08-17 DIAGNOSIS — N952 Postmenopausal atrophic vaginitis: Secondary | ICD-10-CM | POA: Diagnosis not present

## 2016-08-17 DIAGNOSIS — N390 Urinary tract infection, site not specified: Secondary | ICD-10-CM | POA: Diagnosis not present

## 2016-08-17 DIAGNOSIS — R309 Painful micturition, unspecified: Secondary | ICD-10-CM | POA: Diagnosis not present

## 2016-08-17 DIAGNOSIS — N760 Acute vaginitis: Secondary | ICD-10-CM | POA: Diagnosis not present

## 2016-10-21 DIAGNOSIS — B029 Zoster without complications: Secondary | ICD-10-CM | POA: Diagnosis not present

## 2016-11-24 DIAGNOSIS — Z01419 Encounter for gynecological examination (general) (routine) without abnormal findings: Secondary | ICD-10-CM | POA: Diagnosis not present

## 2016-11-24 DIAGNOSIS — Z6821 Body mass index (BMI) 21.0-21.9, adult: Secondary | ICD-10-CM | POA: Diagnosis not present

## 2016-11-24 DIAGNOSIS — Z1231 Encounter for screening mammogram for malignant neoplasm of breast: Secondary | ICD-10-CM | POA: Diagnosis not present

## 2016-12-02 DIAGNOSIS — B029 Zoster without complications: Secondary | ICD-10-CM | POA: Diagnosis not present

## 2017-06-24 DIAGNOSIS — R52 Pain, unspecified: Secondary | ICD-10-CM | POA: Diagnosis not present

## 2017-06-24 DIAGNOSIS — J029 Acute pharyngitis, unspecified: Secondary | ICD-10-CM | POA: Diagnosis not present

## 2017-06-24 DIAGNOSIS — J111 Influenza due to unidentified influenza virus with other respiratory manifestations: Secondary | ICD-10-CM | POA: Diagnosis not present

## 2017-06-30 ENCOUNTER — Other Ambulatory Visit: Payer: Self-pay

## 2017-06-30 ENCOUNTER — Emergency Department (HOSPITAL_BASED_OUTPATIENT_CLINIC_OR_DEPARTMENT_OTHER)
Admission: EM | Admit: 2017-06-30 | Discharge: 2017-06-30 | Disposition: A | Discharge status: Home / Self Care | Payer: BLUE CROSS/BLUE SHIELD | Attending: Emergency Medicine | Admitting: Emergency Medicine

## 2017-06-30 ENCOUNTER — Encounter (HOSPITAL_BASED_OUTPATIENT_CLINIC_OR_DEPARTMENT_OTHER): Payer: Self-pay | Admitting: Emergency Medicine

## 2017-06-30 DIAGNOSIS — E860 Dehydration: Secondary | ICD-10-CM | POA: Insufficient documentation

## 2017-06-30 DIAGNOSIS — R531 Weakness: Secondary | ICD-10-CM | POA: Diagnosis not present

## 2017-06-30 DIAGNOSIS — I951 Orthostatic hypotension: Secondary | ICD-10-CM | POA: Insufficient documentation

## 2017-06-30 DIAGNOSIS — R5383 Other fatigue: Secondary | ICD-10-CM | POA: Diagnosis not present

## 2017-06-30 LAB — COMPREHENSIVE METABOLIC PANEL
ALK PHOS: 74 U/L (ref 38–126)
ALT: 28 U/L (ref 14–54)
ANION GAP: 7 (ref 5–15)
AST: 24 U/L (ref 15–41)
Albumin: 3.6 g/dL (ref 3.5–5.0)
BILIRUBIN TOTAL: 0.3 mg/dL (ref 0.3–1.2)
BUN: 18 mg/dL (ref 6–20)
CALCIUM: 8.7 mg/dL — AB (ref 8.9–10.3)
CO2: 25 mmol/L (ref 22–32)
Chloride: 102 mmol/L (ref 101–111)
Creatinine, Ser: 0.77 mg/dL (ref 0.44–1.00)
GFR calc Af Amer: >60 mL/min (ref >60)
Glucose, Bld: 105 mg/dL — ABNORMAL HIGH (ref 65–99)
Potassium: 4.1 mmol/L (ref 3.5–5.1)
SODIUM: 134 mmol/L — AB (ref 135–145)
TOTAL PROTEIN: 6.6 g/dL (ref 6.5–8.1)

## 2017-06-30 LAB — CBC WITH DIFFERENTIAL/PLATELET
Basophils Absolute: 0 10*3/uL (ref 0.0–0.1)
Basophils Relative: 0 %
EOS ABS: 0 10*3/uL (ref 0.0–0.7)
Eosinophils Relative: 1 %
HEMATOCRIT: 37 % (ref 36.0–46.0)
HEMOGLOBIN: 12.6 g/dL (ref 12.0–15.0)
Lymphocytes Relative: 61 %
Lymphs Abs: 2.7 10*3/uL (ref 0.7–4.0)
MCH: 30.9 pg (ref 26.0–34.0)
MCHC: 34.1 g/dL (ref 30.0–36.0)
MCV: 90.7 fL (ref 78.0–100.0)
Monocytes Absolute: 0.3 10*3/uL (ref 0.1–1.0)
Monocytes Relative: 7 %
NEUTROS PCT: 31 %
Neutro Abs: 1.4 10*3/uL — ABNORMAL LOW (ref 1.7–7.7)
Platelets: 188 10*3/uL (ref 150–400)
RBC: 4.08 MIL/uL (ref 3.87–5.11)
RDW: 11.3 % — ABNORMAL LOW (ref 11.5–15.5)
WBC: 4.5 10*3/uL (ref 4.0–10.5)

## 2017-06-30 MED ORDER — SODIUM CHLORIDE 0.9 % IV BOLUS (SEPSIS)
1000.0000 mL | Freq: Once | INTRAVENOUS | Status: AC
  Administered 2017-06-30: 1000 mL via INTRAVENOUS

## 2017-06-30 NOTE — ED Notes (Signed)
ED Provider at bedside. 

## 2017-06-30 NOTE — ED Provider Notes (Signed)
[facility] EMERGENCY DEPARTMENT Provider Note   CSN: [id number] Arrival date & time: 06/30/17  1757     History   Chief Complaint Chief Complaint  Patient presents with  . Weakness    HPI [name] is a 57 y.o. female who presents with near syncope.  No significant past medical history.  Patient states that she was diagnosed with the flu last week after testing positive at her primary care office.  She states her blood pressure was in the 70s at that time but she does normally have soft blood pressures. She was given a prescription for Tamiflu.  Overall her symptoms have been improving however she has had generalized weakness especially with positional changes.  She has had a cough which is been improving.  She denies any fevers.  She has decreased oral intake.  She feels like her vision will go out when she stands up and so she will lie down and feel better.  She called her primary care office today and they advised her to come to the ED.  She is not on any blood pressure medicines.   HPI  History reviewed. No pertinent past medical history.  There are no active problems to display for this patient.   Past Surgical History:  Procedure Laterality Date  . corotid abscess Right     OB History    No data available       Home Medications    Prior to Admission medications   Not on File    Family History Family History  Problem Relation Age of Onset  . Cancer Mother        lung  . Diabetes Father   . Heart disease Father   . Hyperlipidemia Father     Social History Social History   Tobacco Use  . Smoking status: Never Smoker  . Smokeless tobacco: Never Used  Substance Use Topics  . Alcohol use: No  . Drug use: No     Allergies   Patient has no known allergies.   Review of Systems Review of Systems  Constitutional: Positive for fatigue. Negative for chills and fever.  HENT: Positive for congestion.   Respiratory: Positive for cough.  Negative for shortness of breath.   Cardiovascular: Negative for chest pain.  Gastrointestinal: Positive for diarrhea. Negative for abdominal pain.  Neurological: Positive for weakness (generalized), light-headedness and headaches. Negative for dizziness and syncope.  All other systems reviewed and are negative.    Physical Exam Updated Vital Signs BP (!) 124/46   Pulse 63   Temp 98.5 F (36.9 C) (Oral)   Resp 18   Ht 5\' 3"  (1.6 m)   Wt 54.4 kg (120 lb)   SpO2 99%   BMI 21.26 kg/m   Physical Exam  Constitutional: She is oriented to person, place, and time. She appears well-developed and well-nourished. No distress.  Calm, cooperative, fatigued appearing  HENT:  Head: Normocephalic and atraumatic.  Eyes: Conjunctivae are normal. Pupils are equal, round, and reactive to light. Right eye exhibits no discharge. Left eye exhibits no discharge. No scleral icterus.  Neck: Normal range of motion.  Cardiovascular: Normal rate and regular rhythm. Exam reveals no gallop and no friction rub.  No murmur heard. Pulmonary/Chest: Effort normal and breath sounds normal. No stridor. No respiratory distress. She has no wheezes. She has no rales. She exhibits no tenderness.  Abdominal: Soft. Bowel sounds are normal. She exhibits no distension. There is no tenderness.  Neurological: She  is alert and oriented to person, place, and time.  Skin: Skin is warm and dry.  Psychiatric: She has a normal mood and affect. Her behavior is normal.  Nursing note and vitals reviewed.    ED Treatments / Results  Labs (all labs ordered are listed, but only abnormal results are displayed) Labs Reviewed  CBC WITH DIFFERENTIAL/PLATELET - Abnormal; Notable for the following components:      Result Value   RDW 11.3 (*)    Neutro Abs 1.4 (*)    All other components within normal limits  COMPREHENSIVE METABOLIC PANEL - Abnormal; Notable for the following components:   Sodium 134 (*)    Glucose, Bld 105 (*)     Calcium 8.7 (*)    All other components within normal limits    EKG  EKG Interpretation None       Radiology No results found.  Procedures Procedures (including critical care time)  Medications Ordered in ED Medications  sodium chloride 0.9 % bolus 1,000 mL (0 mLs Intravenous Stopped 06/30/17 2205)     Initial Impression / Assessment and Plan / ED Course  I have reviewed the triage vital signs and the nursing notes.  Pertinent labs & imaging results that were available during my care of the patient were reviewed by me and considered in my medical decision making (see chart for details).  57 year old female with symptoms consistent with dehydration and evidence of orthostatic hypotension.  Blood pressure dropped from systolic of 137 to 94 going from lying to sitting.  Otherwise her vital signs are normal.  Her exam is overall unremarkable. Labs are normal. 1 L bolus IV fluids ordered.  Orthostatic VS for the past 24 hrs:  BP- Lying Pulse- Lying BP- Sitting Pulse- Sitting BP- Standing at 0 minutes Pulse- Standing at 0 minutes  06/30/17 2049 137/87 74 94/60 67 103/86 70    On recheck the patient states she feels much better.  She is able to ambulate to the bathroom without any issues.  She was encouraged to orally rehydrate and to return if worsening.  Final Clinical Impressions(s) / ED Diagnoses   Final diagnoses:  Dehydration  Orthostatic hypotension    ED Discharge Orders    None       Bethel Born, PA-C 06/30/17 2324    Gwyneth Sprout, MD 07/02/17 1349

## 2017-06-30 NOTE — ED Triage Notes (Signed)
Pt c/o generalized weakness and feels like head is heavy; was dx with flu at PCP last week; completed Tamiflu

## 2017-10-06 DIAGNOSIS — M545 Low back pain: Secondary | ICD-10-CM | POA: Diagnosis not present

## 2017-10-27 DIAGNOSIS — R309 Painful micturition, unspecified: Secondary | ICD-10-CM | POA: Diagnosis not present

## 2017-10-27 DIAGNOSIS — R109 Unspecified abdominal pain: Secondary | ICD-10-CM | POA: Diagnosis not present

## 2017-10-27 DIAGNOSIS — M549 Dorsalgia, unspecified: Secondary | ICD-10-CM | POA: Diagnosis not present

## 2017-10-28 DIAGNOSIS — R21 Rash and other nonspecific skin eruption: Secondary | ICD-10-CM | POA: Diagnosis not present

## 2017-12-01 DIAGNOSIS — D649 Anemia, unspecified: Secondary | ICD-10-CM | POA: Diagnosis not present

## 2017-12-01 DIAGNOSIS — R1310 Dysphagia, unspecified: Secondary | ICD-10-CM | POA: Diagnosis not present

## 2017-12-01 DIAGNOSIS — Z0000 Encounter for general adult medical examination without abnormal findings: Secondary | ICD-10-CM | POA: Diagnosis not present

## 2017-12-01 DIAGNOSIS — Z13220 Encounter for screening for lipoid disorders: Secondary | ICD-10-CM | POA: Diagnosis not present

## 2017-12-01 DIAGNOSIS — L659 Nonscarring hair loss, unspecified: Secondary | ICD-10-CM | POA: Diagnosis not present

## 2017-12-28 DIAGNOSIS — Z1231 Encounter for screening mammogram for malignant neoplasm of breast: Secondary | ICD-10-CM | POA: Diagnosis not present

## 2017-12-28 DIAGNOSIS — Z6820 Body mass index (BMI) 20.0-20.9, adult: Secondary | ICD-10-CM | POA: Diagnosis not present

## 2017-12-28 DIAGNOSIS — Z01419 Encounter for gynecological examination (general) (routine) without abnormal findings: Secondary | ICD-10-CM | POA: Diagnosis not present

## 2018-07-06 DIAGNOSIS — J019 Acute sinusitis, unspecified: Secondary | ICD-10-CM | POA: Diagnosis not present

## 2018-07-06 DIAGNOSIS — H8110 Benign paroxysmal vertigo, unspecified ear: Secondary | ICD-10-CM | POA: Diagnosis not present

## 2018-10-18 DIAGNOSIS — L309 Dermatitis, unspecified: Secondary | ICD-10-CM | POA: Diagnosis not present

## 2019-01-03 DIAGNOSIS — Z6821 Body mass index (BMI) 21.0-21.9, adult: Secondary | ICD-10-CM | POA: Diagnosis not present

## 2019-01-03 DIAGNOSIS — Z1231 Encounter for screening mammogram for malignant neoplasm of breast: Secondary | ICD-10-CM | POA: Diagnosis not present

## 2019-01-03 DIAGNOSIS — Z01419 Encounter for gynecological examination (general) (routine) without abnormal findings: Secondary | ICD-10-CM | POA: Diagnosis not present

## 2019-01-31 DIAGNOSIS — Z13820 Encounter for screening for osteoporosis: Secondary | ICD-10-CM | POA: Diagnosis not present

## 2019-02-14 DIAGNOSIS — M8580 Other specified disorders of bone density and structure, unspecified site: Secondary | ICD-10-CM | POA: Diagnosis not present

## 2019-06-14 DIAGNOSIS — I872 Venous insufficiency (chronic) (peripheral): Secondary | ICD-10-CM | POA: Diagnosis not present

## 2019-06-14 DIAGNOSIS — L309 Dermatitis, unspecified: Secondary | ICD-10-CM | POA: Diagnosis not present

## 2020-01-10 DIAGNOSIS — Z6821 Body mass index (BMI) 21.0-21.9, adult: Secondary | ICD-10-CM | POA: Diagnosis not present

## 2020-01-10 DIAGNOSIS — Z1231 Encounter for screening mammogram for malignant neoplasm of breast: Secondary | ICD-10-CM | POA: Diagnosis not present

## 2020-01-10 DIAGNOSIS — Z01419 Encounter for gynecological examination (general) (routine) without abnormal findings: Secondary | ICD-10-CM | POA: Diagnosis not present

## 2020-02-22 DIAGNOSIS — Z7189 Other specified counseling: Secondary | ICD-10-CM | POA: Diagnosis not present

## 2021-01-26 DIAGNOSIS — Z01419 Encounter for gynecological examination (general) (routine) without abnormal findings: Secondary | ICD-10-CM | POA: Diagnosis not present

## 2021-01-26 DIAGNOSIS — Z1231 Encounter for screening mammogram for malignant neoplasm of breast: Secondary | ICD-10-CM | POA: Diagnosis not present

## 2021-01-26 DIAGNOSIS — Z6825 Body mass index (BMI) 25.0-25.9, adult: Secondary | ICD-10-CM | POA: Diagnosis not present

## 2021-05-20 DIAGNOSIS — M26629 Arthralgia of temporomandibular joint, unspecified side: Secondary | ICD-10-CM | POA: Diagnosis not present

## 2021-05-20 DIAGNOSIS — R0981 Nasal congestion: Secondary | ICD-10-CM | POA: Diagnosis not present

## 2021-09-07 DIAGNOSIS — H524 Presbyopia: Secondary | ICD-10-CM | POA: Diagnosis not present

## 2022-02-16 DIAGNOSIS — Z13820 Encounter for screening for osteoporosis: Secondary | ICD-10-CM | POA: Diagnosis not present

## 2022-02-16 DIAGNOSIS — Z01419 Encounter for gynecological examination (general) (routine) without abnormal findings: Secondary | ICD-10-CM | POA: Diagnosis not present

## 2022-02-16 DIAGNOSIS — Z6822 Body mass index (BMI) 22.0-22.9, adult: Secondary | ICD-10-CM | POA: Diagnosis not present

## 2022-02-16 DIAGNOSIS — Z1231 Encounter for screening mammogram for malignant neoplasm of breast: Secondary | ICD-10-CM | POA: Diagnosis not present

## 2022-03-10 DIAGNOSIS — M8580 Other specified disorders of bone density and structure, unspecified site: Secondary | ICD-10-CM | POA: Diagnosis not present

## 2022-03-10 DIAGNOSIS — R1904 Left lower quadrant abdominal swelling, mass and lump: Secondary | ICD-10-CM | POA: Diagnosis not present

## 2022-03-10 DIAGNOSIS — D259 Leiomyoma of uterus, unspecified: Secondary | ICD-10-CM | POA: Diagnosis not present

## 2022-03-10 DIAGNOSIS — N83201 Unspecified ovarian cyst, right side: Secondary | ICD-10-CM | POA: Diagnosis not present

## 2023-08-09 DIAGNOSIS — D270 Benign neoplasm of right ovary: Secondary | ICD-10-CM | POA: Diagnosis not present

## 2023-08-09 DIAGNOSIS — D251 Intramural leiomyoma of uterus: Secondary | ICD-10-CM | POA: Diagnosis not present

## 2024-02-28 DIAGNOSIS — Z1151 Encounter for screening for human papillomavirus (HPV): Secondary | ICD-10-CM | POA: Diagnosis not present

## 2024-02-28 DIAGNOSIS — Z01419 Encounter for gynecological examination (general) (routine) without abnormal findings: Secondary | ICD-10-CM | POA: Diagnosis not present

## 2024-02-28 DIAGNOSIS — Z1231 Encounter for screening mammogram for malignant neoplasm of breast: Secondary | ICD-10-CM | POA: Diagnosis not present

## 2024-02-28 DIAGNOSIS — Z124 Encounter for screening for malignant neoplasm of cervix: Secondary | ICD-10-CM | POA: Diagnosis not present

## 2024-02-28 DIAGNOSIS — Z6822 Body mass index (BMI) 22.0-22.9, adult: Secondary | ICD-10-CM | POA: Diagnosis not present

## 2024-02-28 DIAGNOSIS — Z13820 Encounter for screening for osteoporosis: Secondary | ICD-10-CM | POA: Diagnosis not present
